# Patient Record
Sex: Female | Born: 1980 | Race: Black or African American | Hispanic: No
Health system: Southern US, Community
[De-identification: ages and names within clinical notes are randomized; demographics above are authoritative.]

## PROBLEM LIST (undated history)

## (undated) DIAGNOSIS — I639 Cerebral infarction, unspecified: Secondary | ICD-10-CM

## (undated) DIAGNOSIS — F329 Major depressive disorder, single episode, unspecified: Secondary | ICD-10-CM

## (undated) DIAGNOSIS — F419 Anxiety disorder, unspecified: Secondary | ICD-10-CM

## (undated) DIAGNOSIS — E079 Disorder of thyroid, unspecified: Secondary | ICD-10-CM

## (undated) DIAGNOSIS — K219 Gastro-esophageal reflux disease without esophagitis: Secondary | ICD-10-CM

## (undated) DIAGNOSIS — F4312 Post-traumatic stress disorder, chronic: Secondary | ICD-10-CM

## (undated) DIAGNOSIS — F32A Depression, unspecified: Secondary | ICD-10-CM

## (undated) DIAGNOSIS — I10 Essential (primary) hypertension: Secondary | ICD-10-CM

## (undated) DIAGNOSIS — M1990 Unspecified osteoarthritis, unspecified site: Secondary | ICD-10-CM

## (undated) DIAGNOSIS — I671 Cerebral aneurysm, nonruptured: Secondary | ICD-10-CM

## (undated) DIAGNOSIS — M797 Fibromyalgia: Secondary | ICD-10-CM

## (undated) DIAGNOSIS — R569 Unspecified convulsions: Secondary | ICD-10-CM

## (undated) DIAGNOSIS — F319 Bipolar disorder, unspecified: Secondary | ICD-10-CM

## (undated) DIAGNOSIS — G4730 Sleep apnea, unspecified: Secondary | ICD-10-CM

## (undated) HISTORY — PX: TOTAL THYROIDECTOMY: SHX2547

---
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
## 2017-10-13 HISTORY — PX: BRAIN SURGERY: SHX531

## 2018-09-02 ENCOUNTER — Other Ambulatory Visit: Payer: Self-pay

## 2018-09-02 DIAGNOSIS — Z20822 Contact with and (suspected) exposure to covid-19: Secondary | ICD-10-CM

## 2018-09-04 LAB — NOVEL CORONAVIRUS, NAA: SARS-CoV-2, NAA: NOT DETECTED

## 2018-09-11 ENCOUNTER — Telehealth: Payer: Self-pay

## 2018-09-11 NOTE — Telephone Encounter (Signed)
Patient returned call for [place] lab results - DOB verified - advised of results, no further questions.

## 2018-10-01 ENCOUNTER — Encounter: Payer: Self-pay | Admitting: Emergency Medicine

## 2018-10-01 ENCOUNTER — Emergency Department: Payer: Medicaid Other

## 2018-10-01 ENCOUNTER — Emergency Department
Admission: EM | Admit: 2018-10-01 | Discharge: 2018-10-01 | Disposition: A | Discharge status: Home / Self Care | Payer: Medicaid Other | Attending: Emergency Medicine | Admitting: Emergency Medicine

## 2018-10-01 ENCOUNTER — Other Ambulatory Visit: Payer: Self-pay

## 2018-10-01 DIAGNOSIS — M797 Fibromyalgia: Secondary | ICD-10-CM | POA: Diagnosis not present

## 2018-10-01 DIAGNOSIS — E079 Disorder of thyroid, unspecified: Secondary | ICD-10-CM | POA: Diagnosis not present

## 2018-10-01 DIAGNOSIS — R05 Cough: Secondary | ICD-10-CM | POA: Diagnosis present

## 2018-10-01 DIAGNOSIS — I10 Essential (primary) hypertension: Secondary | ICD-10-CM | POA: Diagnosis not present

## 2018-10-01 DIAGNOSIS — R059 Cough, unspecified: Secondary | ICD-10-CM

## 2018-10-01 DIAGNOSIS — Z8673 Personal history of transient ischemic attack (TIA), and cerebral infarction without residual deficits: Secondary | ICD-10-CM | POA: Insufficient documentation

## 2018-10-01 DIAGNOSIS — R0602 Shortness of breath: Secondary | ICD-10-CM | POA: Diagnosis not present

## 2018-10-01 HISTORY — DX: Unspecified convulsions: R56.9

## 2018-10-01 HISTORY — DX: Unspecified osteoarthritis, unspecified site: M19.90

## 2018-10-01 HISTORY — DX: Disorder of thyroid, unspecified: E07.9

## 2018-10-01 HISTORY — DX: Depression, unspecified: F32.A

## 2018-10-01 HISTORY — DX: Essential (primary) hypertension: I10

## 2018-10-01 HISTORY — DX: Gastro-esophageal reflux disease without esophagitis: K21.9

## 2018-10-01 HISTORY — DX: Sleep apnea, unspecified: G47.30

## 2018-10-01 HISTORY — DX: Cerebral aneurysm, nonruptured: I67.1

## 2018-10-01 HISTORY — DX: Anxiety disorder, unspecified: F41.9

## 2018-10-01 HISTORY — DX: Post-traumatic stress disorder, chronic: F43.12

## 2018-10-01 HISTORY — DX: Cerebral infarction, unspecified: I63.9

## 2018-10-01 HISTORY — DX: Fibromyalgia: M79.7

## 2018-10-01 HISTORY — DX: Bipolar disorder, unspecified: F31.9

## 2018-10-01 LAB — CBC WITH DIFFERENTIAL/PLATELET
Abs Immature Granulocytes: 0.01 10*3/uL (ref 0.00–0.07)
Basophils Absolute: 0 10*3/uL (ref 0.0–0.1)
Basophils Relative: 1 %
Eosinophils Absolute: 0.1 10*3/uL (ref 0.0–0.5)
Eosinophils Relative: 1 %
HCT: 37.9 % (ref 36.0–46.0)
Hemoglobin: 12.8 g/dL (ref 12.0–15.0)
Immature Granulocytes: 0 %
Lymphocytes Relative: 42 %
Lymphs Abs: 2.5 10*3/uL (ref 0.7–4.0)
MCH: 31.4 pg (ref 26.0–34.0)
MCHC: 33.8 g/dL (ref 30.0–36.0)
MCV: 92.9 fL (ref 80.0–100.0)
Monocytes Absolute: 0.4 10*3/uL (ref 0.1–1.0)
Monocytes Relative: 6 %
Neutro Abs: 2.9 10*3/uL (ref 1.7–7.7)
Neutrophils Relative %: 50 %
Platelets: 287 10*3/uL (ref 150–400)
RBC: 4.08 MIL/uL (ref 3.87–5.11)
RDW: 13.9 % (ref 11.5–15.5)
WBC: 5.9 10*3/uL (ref 4.0–10.5)
nRBC: 0 % (ref 0.0–0.2)

## 2018-10-01 LAB — T4, FREE: Free T4: 0.43 ng/dL — ABNORMAL LOW (ref 0.61–1.12)

## 2018-10-01 LAB — BASIC METABOLIC PANEL
Anion gap: 9 (ref 5–15)
BUN: 7 mg/dL (ref 6–20)
CO2: 22 mmol/L (ref 22–32)
Calcium: 9.4 mg/dL (ref 8.9–10.3)
Chloride: 107 mmol/L (ref 98–111)
Creatinine, Ser: 0.87 mg/dL (ref 0.44–1.00)
GFR calc Af Amer: >60 mL/min (ref >60)
GFR calc non Af Amer: >60 mL/min (ref >60)
Glucose, Bld: 125 mg/dL — ABNORMAL HIGH (ref 70–99)
Potassium: 3.4 mmol/L — ABNORMAL LOW (ref 3.5–5.1)
Sodium: 138 mmol/L (ref 135–145)

## 2018-10-01 LAB — BRAIN NATRIURETIC PEPTIDE: B Natriuretic Peptide: 11 pg/mL (ref 0.0–100.0)

## 2018-10-01 LAB — POCT PREGNANCY, URINE: Preg Test, Ur: NEGATIVE

## 2018-10-01 LAB — TSH: TSH: 24.985 u[IU]/mL — ABNORMAL HIGH (ref 0.350–4.500)

## 2018-10-01 MED ORDER — LEVOTHYROXINE SODIUM 150 MCG PO TABS: 150.0000 ug | ORAL_TABLET | Freq: Every day | ORAL | 0 refills | Status: AC

## 2018-10-01 MED ORDER — GUAIFENESIN-CODEINE 100-10 MG/5ML PO SOLN: 5.0000 mL | Freq: Three times a day (TID) | ORAL | 0 refills | Status: AC | PRN

## 2018-10-01 MED ORDER — LEVOTHYROXINE SODIUM 50 MCG PO TABS
150.0000 ug | ORAL_TABLET | Freq: Every day | ORAL | Status: DC
  Administered 2018-10-01: 19:00:00 150 ug via ORAL
  Filled 2018-10-01: qty 3

## 2018-10-01 MED ORDER — GUAIFENESIN-DM 100-10 MG/5ML PO SYRP
5.0000 mL | ORAL_SOLUTION | Freq: Once | ORAL | Status: AC
  Administered 2018-10-01: 5 mL via ORAL
  Filled 2018-10-01: qty 5

## 2018-10-01 NOTE — Discharge Instructions (Signed)
We have prescribed you cough medicine.  Your thyroid levels are also normal and you need to restart taking your thyroid medication.  You should follow-up with your primary care doctor for this.  We have given you GIs number if the sensation does not go away in the next week he can follow-up with them for endoscopy.  Return to ER for vomiting or any other concerns

## 2018-10-01 NOTE — ED Triage Notes (Signed)
Pt to ED via POV pt states that she has been having shortness of breath for the past week. Pt states that she is unable to lay flat because she feels like she is gasping for air. Pt reports that she feels like there is something stuck in her throat and she feels like it is making it hard to breath. Pt is able to speak in complete sentences without difficulty, pt is able to swollen her secretions. Pt is very anxious and tearful in triage but otherwise is in NAD.

## 2018-10-01 NOTE — ED Notes (Signed)
Lab notified to add on BNP 

## 2018-10-01 NOTE — ED Notes (Signed)
Pt in XR. 

## 2018-10-01 NOTE — ED Notes (Signed)
Report given to Dee, RN 

## 2018-10-01 NOTE — ED Provider Notes (Signed)
[facility] Emergency Department Provider Note  ____________________________________________   First MD Initiated Contact with Patient 10/01/18 1802     (approximate)  I have reviewed the triage vital signs and the nursing notes.   HISTORY  Chief Complaint Shortness of Breath    HPI Linda Sweeney is a 38 y.o. female with anxiety, bipolar, depression, hypertension, intracerebral aneurysm who presents with cough and shortness of breath.  Patient says for the past 2 weeks she has felt like there is something in her throat.  She still tolerating eating although not as well as previous.  She says that she continues to have intermittent coughing that is been severe, constant, nothing makes it better, nothing makes it worse.  She is had no vomiting.  No abdominal pain.  Because she feels it is only stuck in her throat she says that it makes her feel short of breath.          Past Medical History:  Diagnosis Date   Anxiety    Arthritis    Bipolar 1 disorder (HCC)    Chronic post-traumatic stress disorder (PTSD)    Depression    Fibromyalgia    GERD (gastroesophageal reflux disease)    Hypertension    Intracerebral aneurysm    Seizures (HCC)    Sleep apnea    Stroke [facility])    Thyroid disease     There are no active problems to display for this patient.  Social: denies alcohol/drugs  Prior to Admission medications   Not on File    Allergies Penicillins and Tomato  No family history on file.  Social History Social History   Tobacco Use   Smoking status: Not on file  Substance Use Topics   Alcohol use: Not on file   Drug use: Not on file      Review of Systems Constitutional: No fever/chills Eyes: No visual changes. ENT: Positive for feeling something stuck in her throat Cardiovascular: No chest pain Respiratory: Positive for SOB Gastrointestinal: No abdominal pain.  No nausea, no vomiting.  No diarrhea.  No  constipation. Genitourinary: Negative for dysuria. Musculoskeletal: Negative for back pain. Skin: Negative for rash. Neurological: Negative for headaches, focal weakness or numbness. All other ROS negative ____________________________________________   PHYSICAL EXAM:  VITAL SIGNS: ED Triage Vitals  Enc Vitals Group     BP 10/01/18 1456 (!) 131/112     Pulse Rate 10/01/18 1456 (!) 114     Resp 10/01/18 1456 (!) 22     Temp 10/01/18 1456 99.7 F (37.6 C)     Temp Source 10/01/18 1456 Oral     SpO2 10/01/18 1456 100 %     Weight 10/01/18 1500 190 lb (86.2 kg)     Height 10/01/18 1500 5\' 7"  (1.702 m)     Head Circumference --      Peak Flow --      Pain Score 10/01/18 1500 9     Pain Loc --      Pain Edu? --      Excl. in [place]? --     Constitutional: Alert and oriented. Well appearing and in no acute distress. Eyes: Conjunctivae are normal. EOMI. Head: Atraumatic. Nose: No congestion/rhinnorhea. Mouth/Throat: Mucous membranes are moist.  OP is clear Neck: No stridor. Trachea Midline. FROM Cardiovascular: Tachycardic, regular rhythm. Grossly normal heart sounds.  Good peripheral circulation. Respiratory:  Gastrointestinal: Soft and nontender. No distention. No abdominal bruits.  Musculoskeletal: No lower extremity tenderness nor edema.  No joint effusions. Neurologic:  Normal speech and language. No gross focal neurologic deficits are appreciated.  Skin:  Skin is warm, dry and intact. No rash noted. Psychiatric: Mood and affect are normal. Speech and behavior are normal. GU: Deferred   ____________________________________________   LABS (all labs ordered are listed, but only abnormal results are displayed)  Labs Reviewed  BASIC METABOLIC PANEL - Abnormal; Notable for the following components:      Result Value   Potassium 3.4 (*)    Glucose, Bld 125 (*)    All other components within normal limits  TSH - Abnormal; Notable for the following components:   TSH 24.985  (*)    All other components within normal limits  T4, FREE - Abnormal; Notable for the following components:   Free T4 0.43 (*)    All other components within normal limits  CBC WITH DIFFERENTIAL/PLATELET  BRAIN NATRIURETIC PEPTIDE  POCT PREGNANCY, URINE  POC URINE PREG, ED   ____________________________________________   ED ECG REPORT I, Concha Se, the attending physician, personally viewed and interpreted this ECG.  EKG is sinus tachycardia rate of 116, no ST elevations, no T wave inversions, normal intervals ____________________________________________  RADIOLOGY Vela Prose, personally viewed and evaluated these images (plain radiographs) as part of my medical decision making, as well as reviewing the written report by the radiologist.  ED MD interpretation: Chest x-ray negative for pneumonia.  Negative neck x-ray  Official radiology report(s): Dg Neck Soft Tissue  Result Date: 10/01/2018 CLINICAL DATA:  Shortness of breath and coughing. History of thyroidectomy. Patient feels as if something is stuck in the trachea. EXAM: NECK SOFT TISSUES - 1+ VIEW COMPARISON:  None. FINDINGS: Multiple surgical clips in the neck are consistent with history of thyroidectomy. This limits evaluation for foreign bodies. Within this limitation, no definite tracheal foreign bodies are identified. The lung apices are normal. The prevertebral soft tissues and tissues of the pharynx are normal in appearance. No acute abnormalities. IMPRESSION: Evaluation for foreign bodies is limited due to the multiple surgical clips from thyroidectomy. Within this limitation, there is no convincing evidence of foreign body seen in the trachea, pharynx, or proximal esophagus. Electronically Signed   By: Gerome Sam III M.D   On: 10/01/2018 18:55   Dg Chest 2 View  Result Date: 10/01/2018 CLINICAL DATA:  Shortness of breath EXAM: CHEST - 2 VIEW COMPARISON:  None. FINDINGS: The heart size and mediastinal  contours are within normal limits. Both lungs are clear. The visualized skeletal structures are unremarkable. IMPRESSION: No active cardiopulmonary disease. Electronically Signed   By: Katherine Mantle M.D.   On: 10/01/2018 15:38    ____________________________________________   PROCEDURES  Procedure(s) performed (including Critical Care):  Procedures   ____________________________________________   INITIAL IMPRESSION / ASSESSMENT AND PLAN / ED COURSE   Linda Sweeney was evaluated in Emergency Department on 10/01/2018 for the symptoms described in the history of present illness. She was evaluated in the context of the global COVID-19 pandemic, which necessitated consideration that the patient might be at risk for infection with the SARS-CoV-2 virus that causes COVID-19. Institutional protocols and algorithms that pertain to the evaluation of patients at risk for COVID-19 are in a state of rapid change based on information released by regulatory bodies including the CDC and federal and state organizations. These policies and algorithms were followed during the patient's care in the ED.     Patient presents with shortness of breath and cough and feeling  like something might be stuck in her throat. Consider pill esophagitis, less likely food impaction given no vomiting and well-appearing.  PNA-will get xray to evaluation Anemia-CBC to evaluate Low suspicion for ACS given no chest pain but will get EKG to confirm Arrhythmia-Will get EKG and keep on monitor.  PE-lower suspicion given no risk factors and other cause more likely.   Patient says she has not been taking her medications.  Patient had parathyroidectomy.  Patient's labs are consistent with hypothyroidism.  Patient has no signs of myxedema coma at this time however I explained patient that she needs to follow-up with this and make sure that she is taking her medications.  Pregnancy test is negative.  No white count making  infection less likely  Patient is able to tolerate p.o.  I do not see a need for an urgent endoscopy.  I given patient GIs number to follow-up with outpatient.  I given her some cough syrup.  7:55 PM reevaluated patient.  Patient continues look very well.  No coughing at this time.  The medicine seem to work well.  I discussed with patient her low thyroid levels.  Patient says that she is not been taking her thyroid medications for the past month because her medic patient was sent to the wrong pharmacy.  We will provide her prescription today for that as well as the cough syrup.  Patient will follow-up with GI if she continues to have that sensation for endoscopy.  I discussed the provisional nature of ED diagnosis, the treatment so far, the ongoing plan of care, follow up appointments and return precautions with the patient and any family or support people present. They expressed understanding and agreed with the plan, discharged home.    ____________________________________________   FINAL CLINICAL IMPRESSION(S) / ED DIAGNOSES   Final diagnoses:  Cough     MEDICATIONS GIVEN DURING THIS VISIT:  Medications  levothyroxine (SYNTHROID) tablet 150 mcg (150 mcg Oral Given 10/01/18 1927)  guaiFENesin-dextromethorphan (ROBITUSSIN DM) 100-10 MG/5ML syrup 5 mL (5 mLs Oral Given 10/01/18 1846)     ED Discharge Orders         Ordered    guaiFENesin-codeine 100-10 MG/5ML syrup  3 times daily PRN     10/01/18 1959    levothyroxine (SYNTHROID) 150 MCG tablet  Daily     10/01/18 1959           Note:  This document was prepared using Dragon voice recognition software and may include unintentional dictation errors.   Concha SeFunke, Bradi Arbuthnot E, MD 10/01/18 2001

## 2019-11-22 IMAGING — CR NECK SOFT TISSUES - 1+ VIEW
1 series · 2 of 2 positions shown · non-contrast
Comparison: None.

CLINICAL DATA: Shortness of breath and coughing. History of
thyroidectomy. Patient feels as if something is stuck in the
trachea.

EXAM:
NECK SOFT TISSUES - 1+ VIEW

[Series 1: dg neck soft tissue · 0.14mm/px · 2 of 2 slices shown]
[im 1/2]
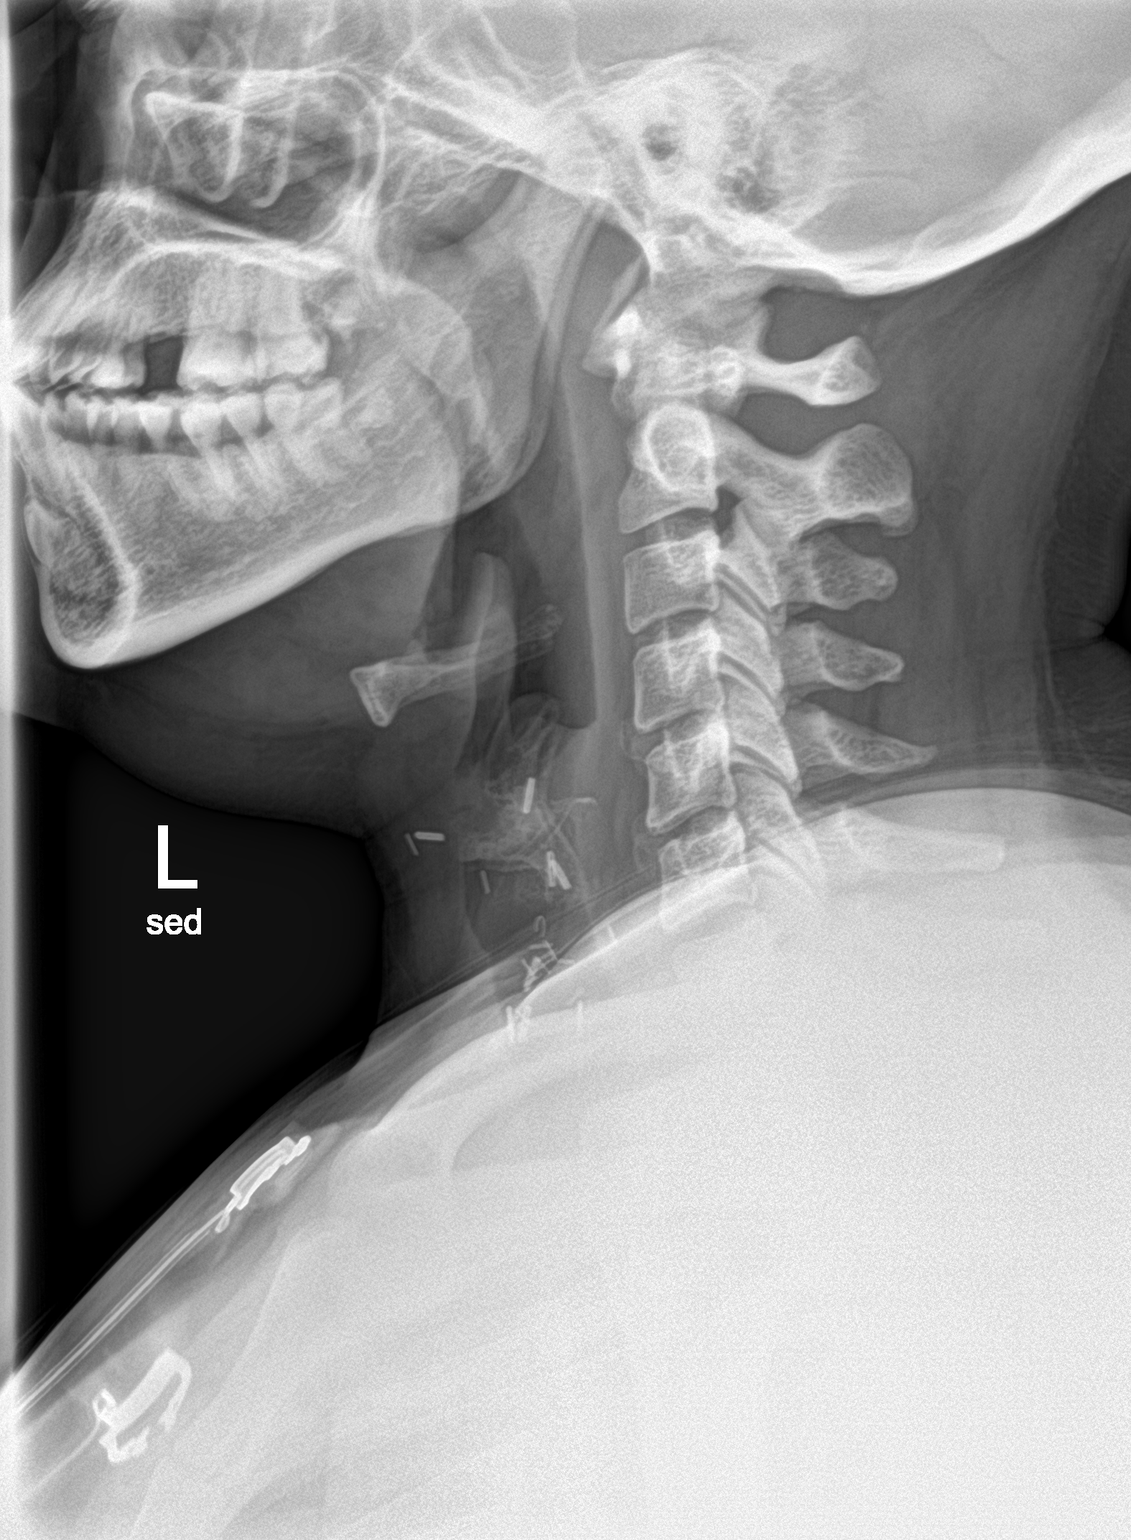
[im 2/2]
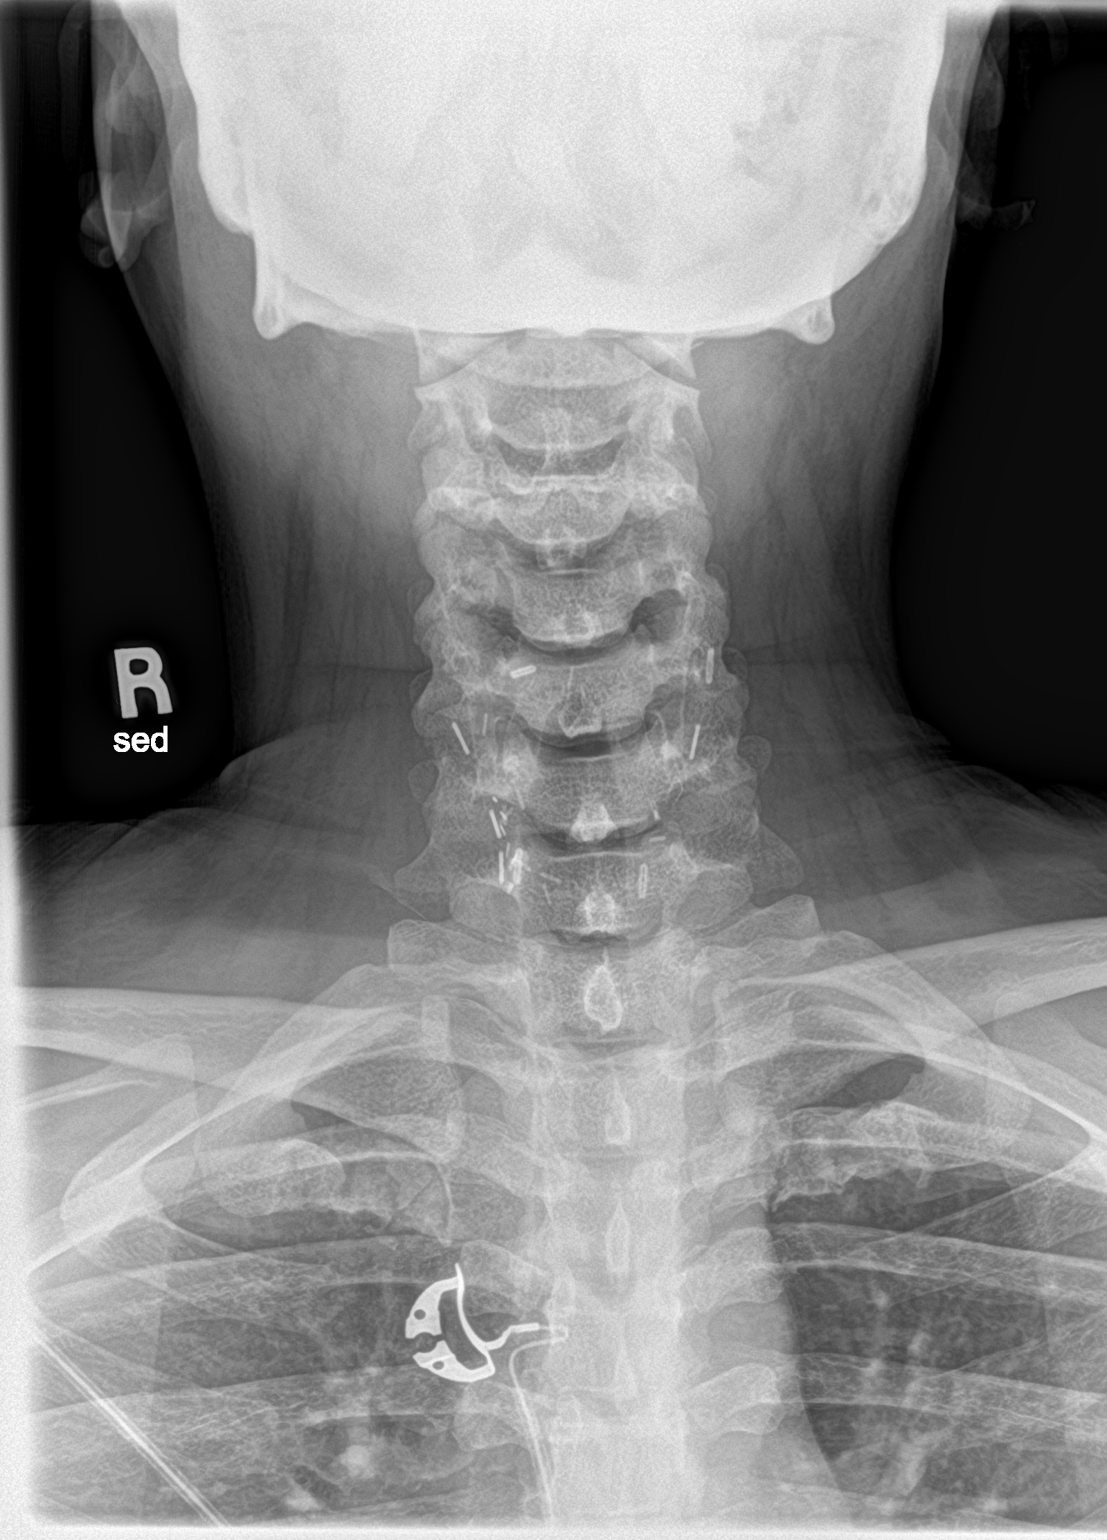

[2 of 2 positions shown; findings below may reference images not displayed]

FINDINGS: Multiple surgical clips in the neck are consistent with history of
thyroidectomy. This limits evaluation for foreign bodies. Within
this limitation, no definite tracheal foreign bodies are identified.
The lung apices are normal. The prevertebral soft tissues and
tissues of the pharynx are normal in appearance. No acute
abnormalities.
IMPRESSION: Evaluation for foreign bodies is limited due to the multiple
surgical clips from thyroidectomy. Within this limitation, there is
no convincing evidence of foreign body seen in the trachea, pharynx,
or proximal esophagus.

## 2019-11-22 IMAGING — CR CHEST - 2 VIEW
1 series · 2 of 2 positions shown · non-contrast
Comparison: None.

CLINICAL DATA: Shortness of breath

EXAM:
CHEST - 2 VIEW

[Series 1: dg chest 2 view · 0.14mm/px · 2 of 2 slices shown]
[im 1/2]
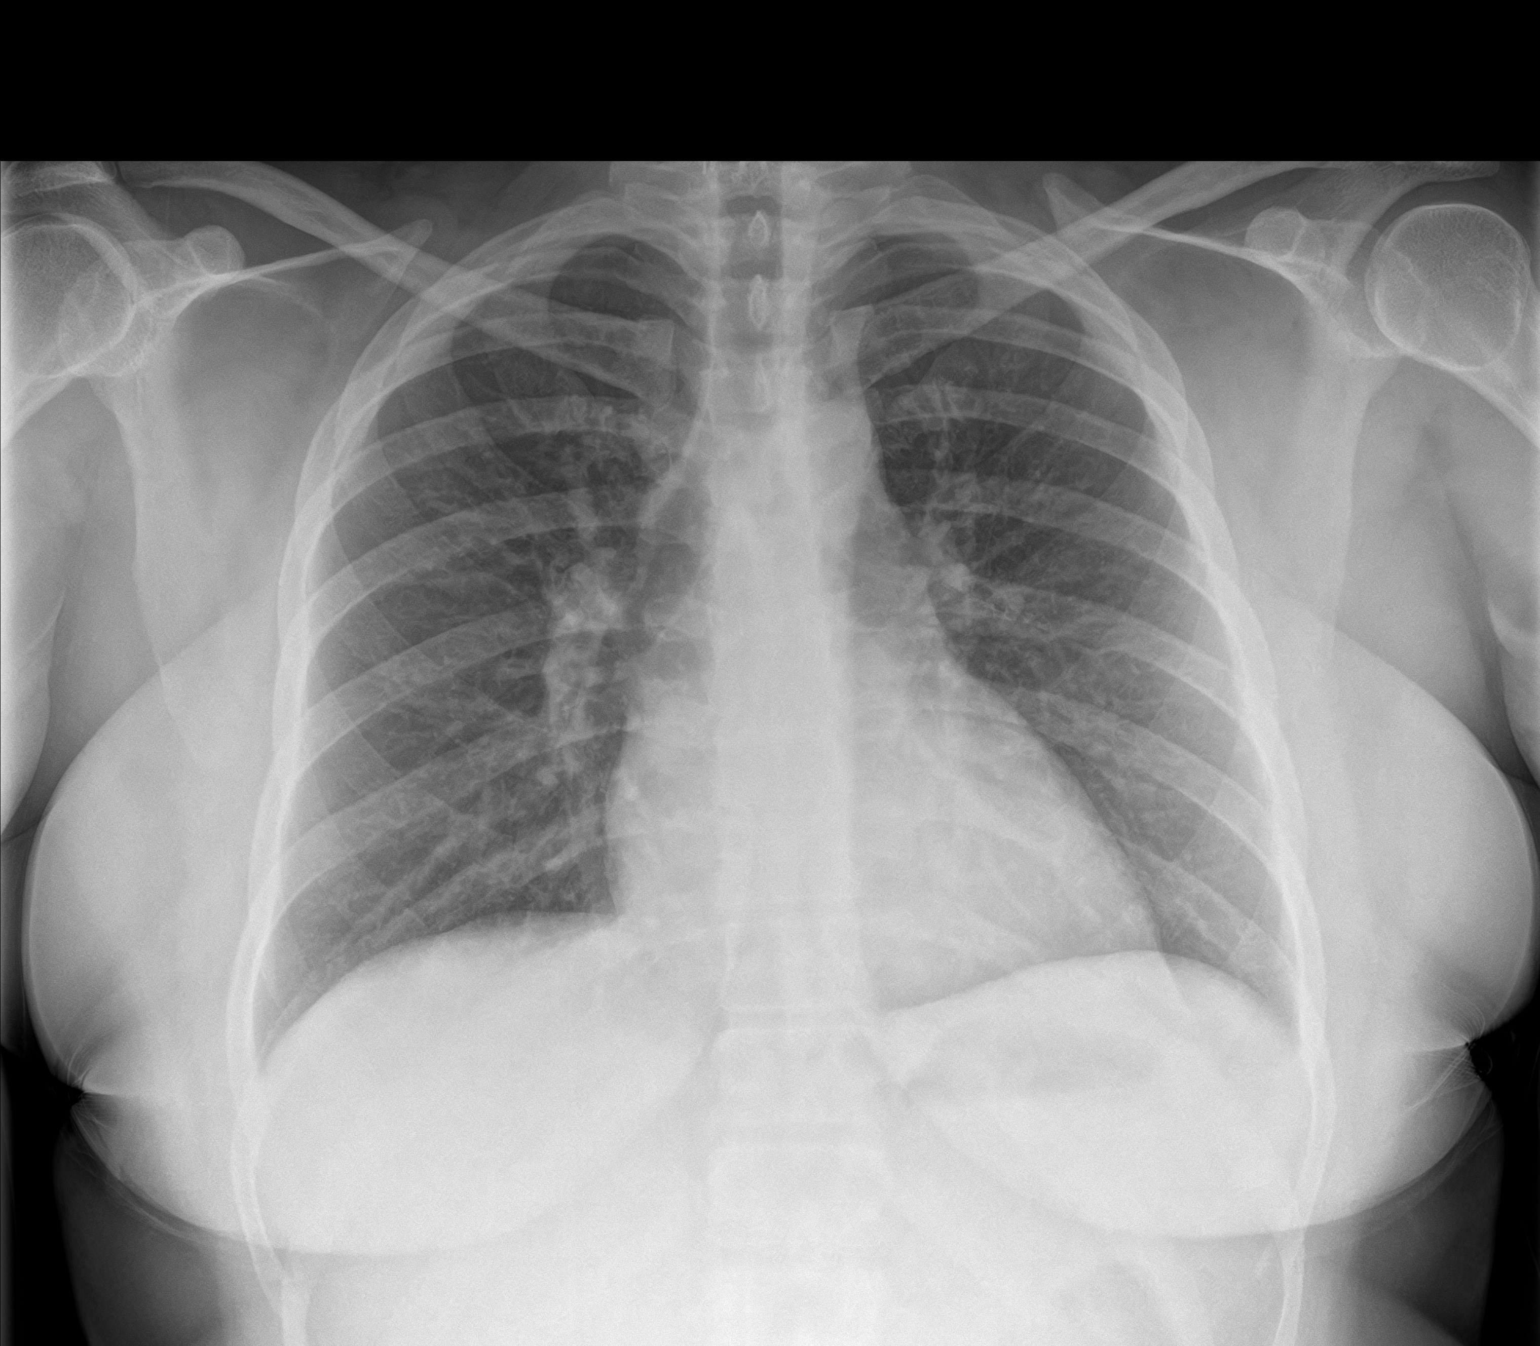
[im 2/2]
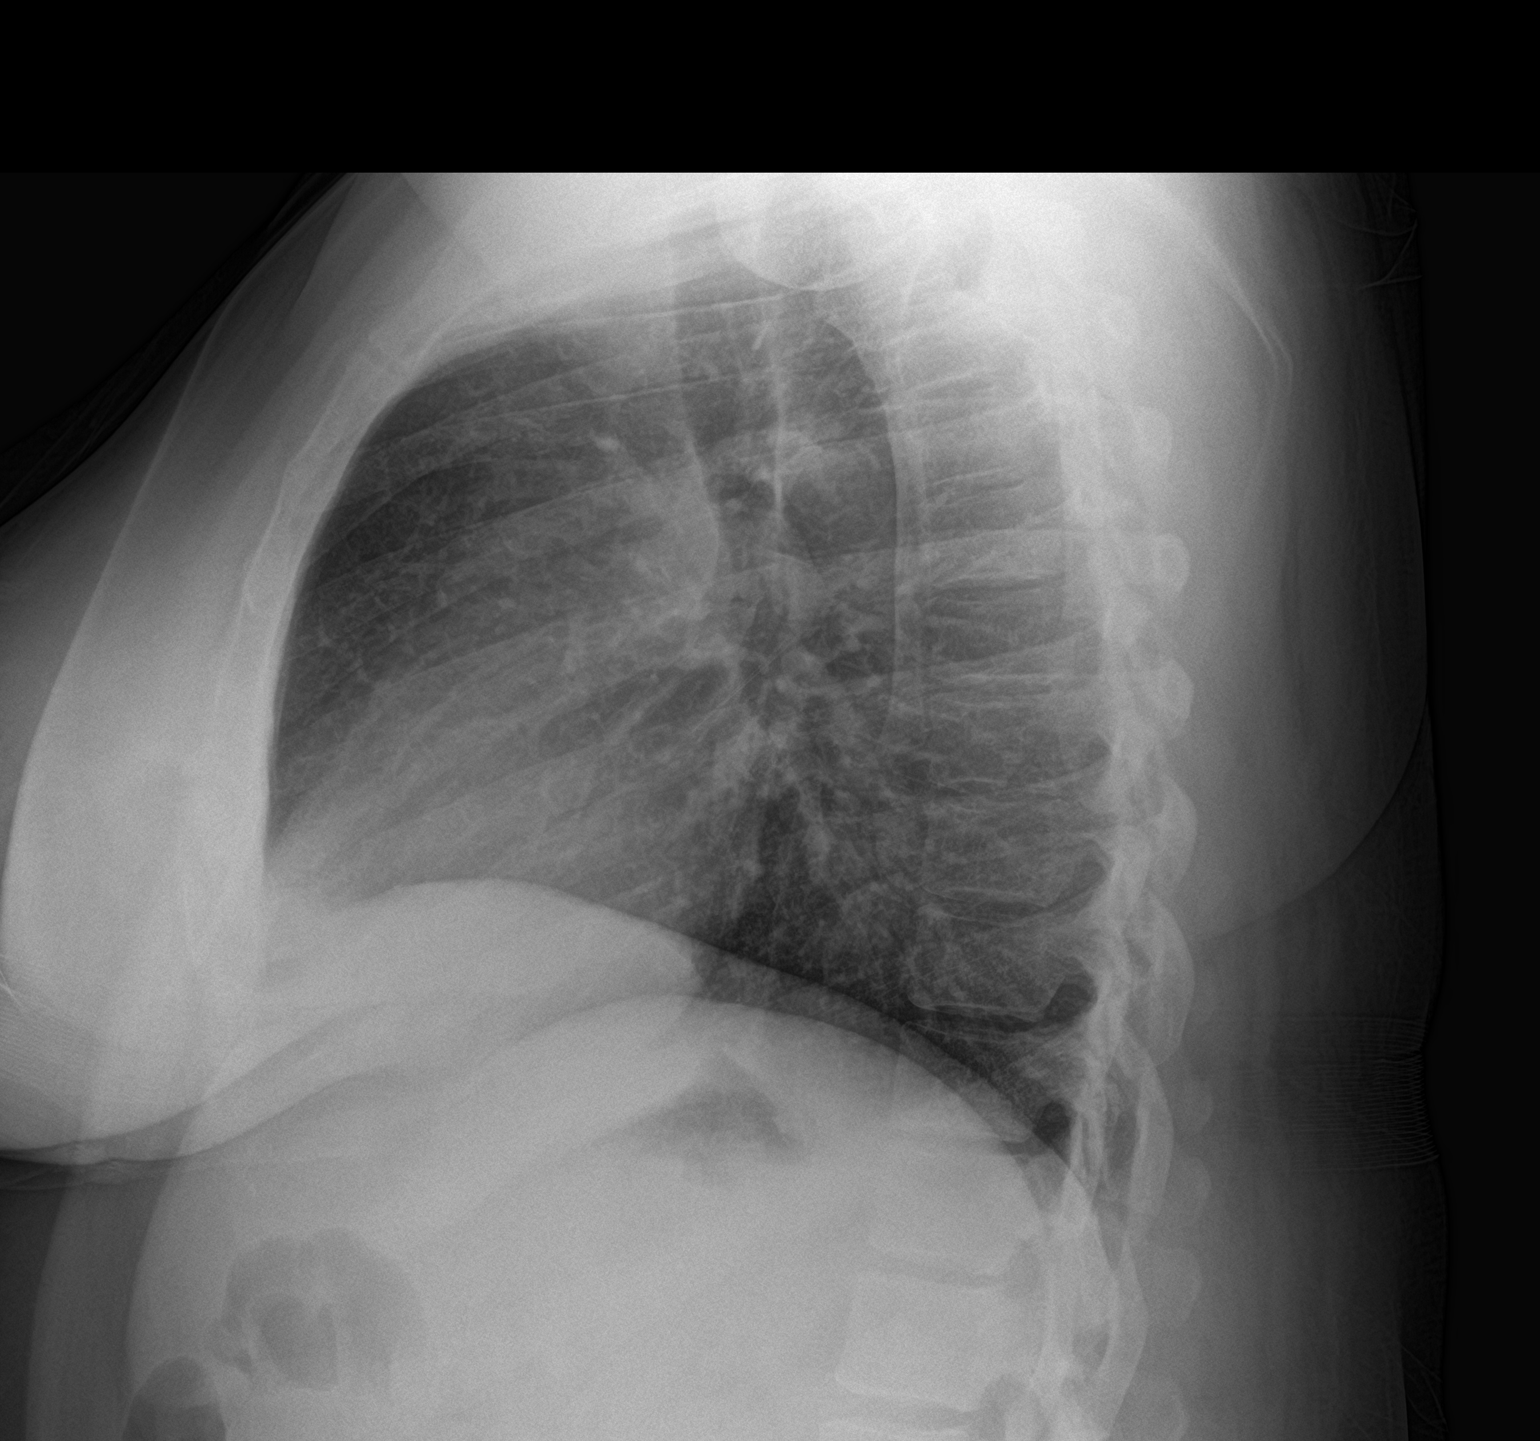

[2 of 2 positions shown; findings below may reference images not displayed]

FINDINGS: The heart size and mediastinal contours are within normal limits.
Both lungs are clear. The visualized skeletal structures are
unremarkable.
IMPRESSION: No active cardiopulmonary disease.
